# Patient Record
Sex: Male | Born: 1965 | Race: White | Hispanic: No | Marital: Single
Health system: Southern US, Community
[De-identification: ages and names within clinical notes are randomized; demographics above are authoritative.]

---

## 2020-06-20 ENCOUNTER — Ambulatory Visit: Payer: Self-pay | Attending: Internal Medicine

## 2020-06-20 DIAGNOSIS — Z23 Encounter for immunization: Secondary | ICD-10-CM

## 2020-06-20 NOTE — Progress Notes (Signed)
   Covid-19 Vaccination Clinic  Name:  Jeffrey Noble    MRN: [id number] DOB: 10/13/1965  06/20/2020  [name] was observed post Covid-19 immunization for 15 minutes without incident. He was provided with Vaccine Information Sheet and instruction to access the V-Safe system.   [name] was instructed to call 911 with any severe reactions post vaccine: Marland Kitchen Difficulty breathing  . Swelling of face and throat  . A fast heartbeat  . A bad rash all over body  . Dizziness and weakness   Immunizations Administered    No immunizations on file.

## 2020-07-29 ENCOUNTER — Encounter (HOSPITAL_COMMUNITY): Payer: Self-pay | Admitting: Emergency Medicine

## 2020-07-29 ENCOUNTER — Emergency Department (HOSPITAL_COMMUNITY)
Admission: EM | Admit: 2020-07-29 | Discharge: 2020-07-29 | Disposition: A | Discharge status: Home / Self Care | Attending: Emergency Medicine | Admitting: Emergency Medicine

## 2020-07-29 ENCOUNTER — Emergency Department (HOSPITAL_COMMUNITY)

## 2020-07-29 DIAGNOSIS — N201 Calculus of ureter: Secondary | ICD-10-CM | POA: Diagnosis not present

## 2020-07-29 DIAGNOSIS — R109 Unspecified abdominal pain: Secondary | ICD-10-CM | POA: Diagnosis present

## 2020-07-29 LAB — CBC
HCT: 48.9 % (ref 39.0–52.0)
Hemoglobin: 17.2 g/dL — ABNORMAL HIGH (ref 13.0–17.0)
MCH: 32.1 pg (ref 26.0–34.0)
MCHC: 35.2 g/dL (ref 30.0–36.0)
MCV: 91.2 fL (ref 80.0–100.0)
Platelets: 267 10*3/uL (ref 150–400)
RBC: 5.36 MIL/uL (ref 4.22–5.81)
RDW: 13 % (ref 11.5–15.5)
WBC: 14.7 10*3/uL — ABNORMAL HIGH (ref 4.0–10.5)
nRBC: 0 % (ref 0.0–0.2)

## 2020-07-29 LAB — URINALYSIS, ROUTINE W REFLEX MICROSCOPIC
Bilirubin Urine: NEGATIVE
Glucose, UA: NEGATIVE mg/dL
Hgb urine dipstick: NEGATIVE
Ketones, ur: 80 mg/dL — AB
Leukocytes,Ua: NEGATIVE
Nitrite: NEGATIVE
Protein, ur: 30 mg/dL — AB
Specific Gravity, Urine: 1.023 (ref 1.005–1.030)
pH: 6 (ref 5.0–8.0)

## 2020-07-29 LAB — BASIC METABOLIC PANEL
Anion gap: 13 (ref 5–15)
BUN: 20 mg/dL (ref 6–20)
CO2: 22 mmol/L (ref 22–32)
Calcium: 9.7 mg/dL (ref 8.9–10.3)
Chloride: 103 mmol/L (ref 98–111)
Creatinine, Ser: 1.63 mg/dL — ABNORMAL HIGH (ref 0.61–1.24)
GFR, Estimated: 50 mL/min — ABNORMAL LOW (ref >60)
Glucose, Bld: 133 mg/dL — ABNORMAL HIGH (ref 70–99)
Potassium: 3.3 mmol/L — ABNORMAL LOW (ref 3.5–5.1)
Sodium: 138 mmol/L (ref 135–145)

## 2020-07-29 MED ORDER — SODIUM CHLORIDE 0.9 % IV BOLUS (SEPSIS)
1000.0000 mL | Freq: Once | INTRAVENOUS | Status: AC
  Administered 2020-07-29: 09:00:00 1000 mL via INTRAVENOUS

## 2020-07-29 MED ORDER — ONDANSETRON HCL 4 MG/2ML IJ SOLN
4.0000 mg | Freq: Once | INTRAMUSCULAR | Status: AC
  Administered 2020-07-29: 08:00:00 4 mg via INTRAVENOUS
  Filled 2020-07-29: qty 2

## 2020-07-29 MED ORDER — TAMSULOSIN HCL 0.4 MG PO CAPS: 0.4000 mg | ORAL_CAPSULE | Freq: Every day | ORAL | 0 refills | Status: AC

## 2020-07-29 MED ORDER — KETOROLAC TROMETHAMINE 30 MG/ML IJ SOLN
30.0000 mg | Freq: Once | INTRAMUSCULAR | Status: AC
  Administered 2020-07-29: 08:00:00 30 mg via INTRAVENOUS
  Filled 2020-07-29: qty 1

## 2020-07-29 MED ORDER — HYDROCODONE-ACETAMINOPHEN 5-325 MG PO TABS: 1.0000 | ORAL_TABLET | Freq: Four times a day (QID) | ORAL | 0 refills | Status: AC | PRN

## 2020-07-29 MED ORDER — SODIUM CHLORIDE 0.9 % IV SOLN
1000.0000 mL | INTRAVENOUS | Status: DC
  Administered 2020-07-29: 09:00:00 1000 mL via INTRAVENOUS

## 2020-07-29 MED ORDER — ONDANSETRON 4 MG PO TBDP
4.0000 mg | ORAL_TABLET | Freq: Once | ORAL | Status: AC
  Administered 2020-07-29: 07:00:00 4 mg via ORAL
  Filled 2020-07-29: qty 1

## 2020-07-29 MED ORDER — ONDANSETRON 8 MG PO TBDP: 8.0000 mg | ORAL_TABLET | Freq: Three times a day (TID) | ORAL | 0 refills | Status: AC | PRN

## 2020-07-29 NOTE — ED Notes (Signed)
Patient transported to X-ray 

## 2020-07-29 NOTE — Discharge Instructions (Addendum)
The xray did show an area in the iliac bone which may be just a benign finding but the radiologist did recommend having that compared to your recent CT scan.  Follow up with your primary care doctor to review.  Call the urology office to schedule the appointment

## 2020-07-29 NOTE — ED Provider Notes (Signed)
[facility]-EMERGENCY DEPT Provider Note   CSN: [id number] Arrival date & time: 07/29/20  2330     History Chief Complaint  Patient presents with  . Flank Pain    Jeffrey Noble is a 54 y.o. male.  HPI   Patient presents to the ED for evaluation of persistent left flank pain as well as nausea.  Patient states he started having symptoms about a week ago.  He went to the [place].  He ended up having a CT scan and he has the results with him.  It demonstrated a 6 mm left-sided ureteral stone.  Patient states he was prescribed Flomax as well as pain and nausea medications.  He has been taking them but this morning he was having more significant pain and nausea.  He threw up his pain medication.  He continues to feel like he has to urinate but only goes small amounts.  Patient has not been taking the Flomax because he felt like it only made him urinate a lot.  [facility] but he has not had that referral yet.  He denies any fevers or chills.  History reviewed. No pertinent past medical history.  There are no problems to display for this patient.   History reviewed. No pertinent surgical history.     History reviewed. No pertinent family history.     Home Medications Prior to Admission medications   Medication Sig Start Date End Date Taking? Authorizing Provider  escitalopram (LEXAPRO) 10 MG tablet Take 10 mg by mouth daily. 06/22/20   [provider]  gemfibrozil (LOPID) 600 MG tablet Take 600 mg by mouth 2 (two) times daily. 06/30/20   [provider]  HYDROcodone-acetaminophen (NORCO/VICODIN) 5-325 MG tablet Take 1 tablet by mouth every 6 (six) hours as needed. 07/29/20   Linwood Dibbles, MD  meloxicam (MOBIC) 15 MG tablet Take 15 mg by mouth daily. 06/30/20   [provider]  omeprazole (PRILOSEC) 20 MG capsule Take 20 mg by mouth 2 (two) times daily. 06/30/20   [provider]  ondansetron (ZOFRAN ODT) 8 MG disintegrating tablet Take 1 tablet (8 mg total) by mouth every 8 (eight) hours as needed for nausea or vomiting. 07/29/20   Linwood Dibbles, MD  SKYRIZI, 150 MG DOSE, 75 MG/0.83ML PSKT Inject 150 mg into the skin every 3 (three) months. 06/01/20   [provider]  tamsulosin (FLOMAX) 0.4 MG CAPS capsule Take 1 capsule (0.4 mg total) by mouth daily. 07/29/20   Linwood Dibbles, MD  traZODone (DESYREL) 50 MG tablet Take 50 mg by mouth at bedtime. 06/22/20   [provider]    Allergies    Patient has no known allergies.  Review of Systems   Review of Systems  All other systems reviewed and are negative.   Physical Exam Updated Vital Signs BP (!) 153/99   Pulse 69   Temp 97.9 F (36.6 C) (Oral)   Resp 13   Ht 1.753 m (5\' 9" )   Wt 89.8 kg   SpO2 99%   BMI 29.24 kg/m   Physical Exam Vitals and nursing note reviewed.  Constitutional:      Appearance: He is well-developed and well-nourished. He is not diaphoretic.  HENT:     Head: Normocephalic and atraumatic.     Right Ear: External ear normal.     Left Ear: External ear normal.  Eyes:     General: No  scleral icterus.       Right eye: No discharge.        Left eye: No discharge.     Conjunctiva/sclera: Conjunctivae normal.  Neck:     Trachea: No tracheal deviation.  Cardiovascular:     Rate and Rhythm: Normal rate and regular rhythm.     Pulses: Intact distal pulses.  Pulmonary:     Effort: Pulmonary effort is normal. No respiratory distress.     Breath sounds: Normal breath sounds. No stridor. No wheezing or rales.  Abdominal:     General: Bowel sounds are normal. There is no distension.     Palpations: Abdomen is soft.     Tenderness: There is no abdominal tenderness. There is left CVA tenderness. There is no guarding or rebound.  Musculoskeletal:        General: No tenderness or edema.     Cervical back: Neck supple.  Skin:    General: Skin is warm and dry.      Findings: No rash.  Neurological:     Mental Status: He is alert.     Cranial Nerves: No cranial nerve deficit (no facial droop, extraocular movements intact, no slurred speech).     Sensory: No sensory deficit.     Motor: No abnormal muscle tone or seizure activity.     Coordination: Coordination normal.     Deep Tendon Reflexes: Strength normal.  Psychiatric:        Mood and Affect: Mood and affect normal.     ED Results / Procedures / Treatments   Labs (all labs ordered are listed, but only abnormal results are displayed) Labs Reviewed  URINALYSIS, ROUTINE W REFLEX MICROSCOPIC - Abnormal; Notable for the following components:      Result Value   Ketones, ur 80 (*)    Protein, ur 30 (*)    Bacteria, UA RARE (*)    All other components within normal limits  CBC - Abnormal; Notable for the following components:   WBC 14.7 (*)    Hemoglobin 17.2 (*)    All other components within normal limits  BASIC METABOLIC PANEL - Abnormal; Notable for the following components:   Potassium 3.3 (*)    Glucose, Bld 133 (*)    Creatinine, Ser 1.63 (*)    GFR, Estimated 50 (*)    All other components within normal limits  URINE CULTURE    EKG None  Radiology DG Abdomen 1 View  Result Date: 07/29/2020 CLINICAL DATA:  Left flank pain. Nephrolithiasis seen on outside imaging. EXAM: ABDOMEN - 1 VIEW COMPARISON:  None available. FINDINGS: The bowel gas pattern is normal. No radio-opaque calculi or other significant radiographic abnormality are seen. 2.6 cm sclerotic lesion within the inferior right iliac bone, nonspecific. IMPRESSION: 1. No renal calculi identified. 2. Incidental 2.6 cm sclerotic lesion within the inferior right iliac bone, nonspecific but may represent a bone island. Comparison with prior outside imaging of the pelvis would be helpful to to assess for stability. Electronically Signed   By: Duanne Guess D.O.   On: 07/29/2020 08:38    Procedures Procedures (including  critical care time)  Medications Ordered in ED Medications  sodium chloride 0.9 % bolus 1,000 mL (0 mLs Intravenous Stopped 07/29/20 0903)    Followed by  0.9 %  sodium chloride infusion (1,000 mLs Intravenous New Bag/Given 07/29/20 0920)  ondansetron (ZOFRAN-ODT) disintegrating tablet 4 mg (4 mg Oral Given 07/29/20 0725)  ketorolac (TORADOL) 30 MG/ML injection 30 mg (30  mg Intravenous Given 07/29/20 0800)  ondansetron (ZOFRAN) injection 4 mg (4 mg Intravenous Given 07/29/20 0800)    ED Course  I have reviewed the triage vital signs and the nursing notes.  Pertinent labs & imaging results that were available during my care of the patient were reviewed by me and considered in my medical decision making (see chart for details).  Clinical Course as of 07/29/20 1419  Wed Jul 29, 2020  1210 Plain film does not show any ureteral stones although patient did have this confirmed on her prior outpatient CT.  Incidental bony abnormality noted [JK]  1346 Patient's laboratory tests showed no evaded creatinine.  Urinalysis does show white blood cells and RBCs but no definite infection. [JK]    Clinical Course User Index [JK] Linwood Dibbles, MD   MDM Rules/Calculators/A&P                          Patient presented to the ED for evaluation of persistent flank pain associated with recently diagnosed ureteral stone. In the ED the patient is still having some flank pain but this was managed with a dose of Toradol. Patient's laboratory tests do show an increase in his creatinine but I do not have any old for comparison. Urinalysis does not look like infection. I discussed the case with Dr. Jacquelyne Balint urology. Plan on close outpatient follow-up in the urology office. Patient will call the office to schedule appointment. Continue the patient on Zofran hydrocodone and Flomax.  Incidental finding noted in the iliac region. We will asked patient follow-up with and compared to his recent CT scan Final Clinical  Impression(s) / ED Diagnoses Final diagnoses:  Ureteral stone    Rx / DC Orders ED Discharge Orders         Ordered    HYDROcodone-acetaminophen (NORCO/VICODIN) 5-325 MG tablet  Every 6 hours PRN        07/29/20 1418    ondansetron (ZOFRAN ODT) 8 MG disintegrating tablet  Every 8 hours PRN        07/29/20 1418    tamsulosin (FLOMAX) 0.4 MG CAPS capsule  Daily        07/29/20 1418           Linwood Dibbles, MD 07/29/20 1421

## 2020-07-29 NOTE — ED Triage Notes (Signed)
Pt reports known kidney stones. States that he has imaging from the [place]. Reports nausea and L flank pain. Reports difficulty urinating.

## 2020-07-29 NOTE — ED Notes (Signed)
Completed bladder scan. Bladder scan showed volume of 62 mL.

## 2020-07-31 LAB — URINE CULTURE: Culture: <10000 — AB

## 2020-12-14 ENCOUNTER — Other Ambulatory Visit: Payer: Self-pay

## 2020-12-14 ENCOUNTER — Other Ambulatory Visit (HOSPITAL_BASED_OUTPATIENT_CLINIC_OR_DEPARTMENT_OTHER): Payer: Self-pay

## 2020-12-14 ENCOUNTER — Ambulatory Visit: Attending: Internal Medicine

## 2020-12-14 DIAGNOSIS — Z23 Encounter for immunization: Secondary | ICD-10-CM

## 2020-12-14 MED ORDER — MODERNA COVID-19 VACCINE 100 MCG/0.5ML IM SUSP
INTRAMUSCULAR | 0 refills | Status: AC
  Filled 2020-12-14: qty 0.25, 1d supply, fill #0

## 2020-12-14 NOTE — Progress Notes (Signed)
   Covid-19 Vaccination Clinic  Name:  Anant Agard    MRN: [id number] DOB: Jan 03, 1966  12/14/2020  Mr. Gingerich was observed post Covid-19 immunization for 15 minutes without incident. He was provided with Vaccine Information Sheet and instruction to access the V-Safe system.   Mr. Thall was instructed to call 911 with any severe reactions post vaccine: Marland Kitchen Difficulty breathing  . Swelling of face and throat  . A fast heartbeat  . A bad rash all over body  . Dizziness and weakness   Immunizations Administered    Name Date Dose VIS Date Route   Moderna Covid-19 Booster Vaccine 12/14/2020  1:39 PM 0.25 mL 05/27/2020 Intramuscular   Manufacturer: Moderna   Lot: [id number]   NDC: 20254-270-62

## 2021-06-17 ENCOUNTER — Ambulatory Visit

## 2021-09-08 IMAGING — CR DG ABDOMEN 1V
2 series · 2 of 2 positions shown · non-contrast
Comparison: None available.

CLINICAL DATA: Left flank pain. Nephrolithiasis seen on outside
imaging.

EXAM:
ABDOMEN - 1 VIEW

[t abdomen supine (1 of 2)]
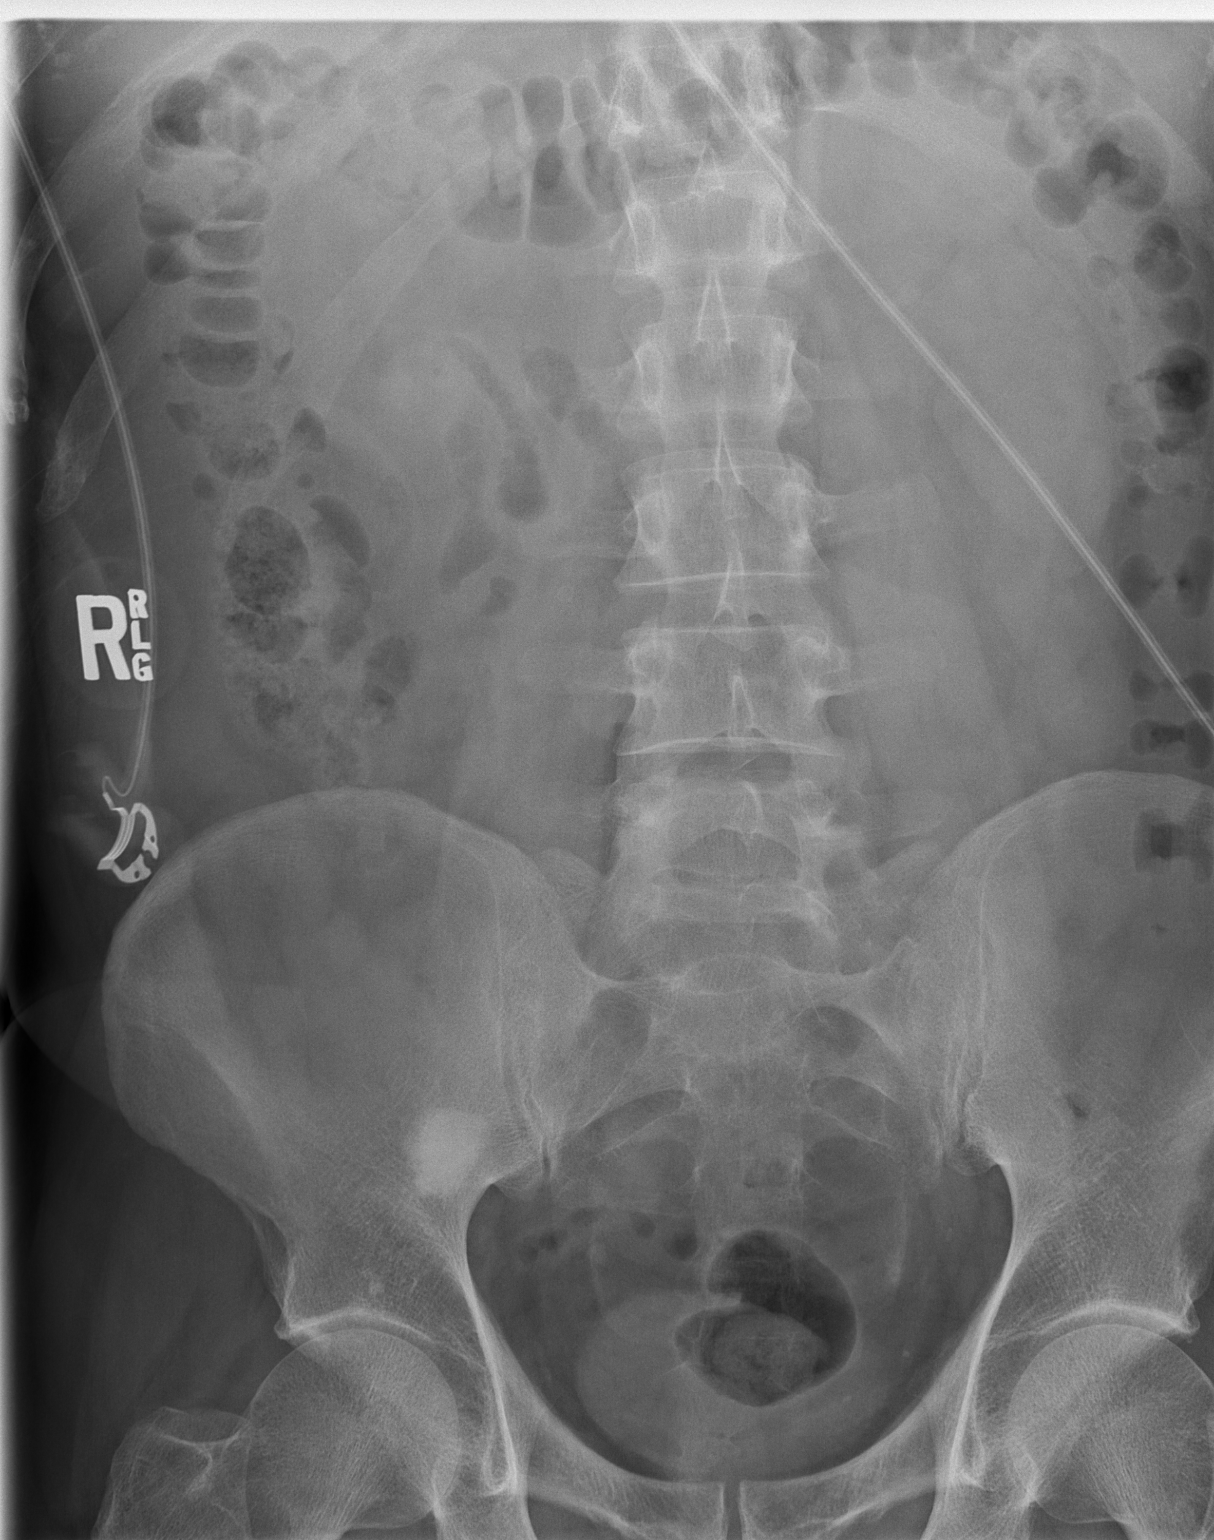

[t abdomen supine (2 of 2)]
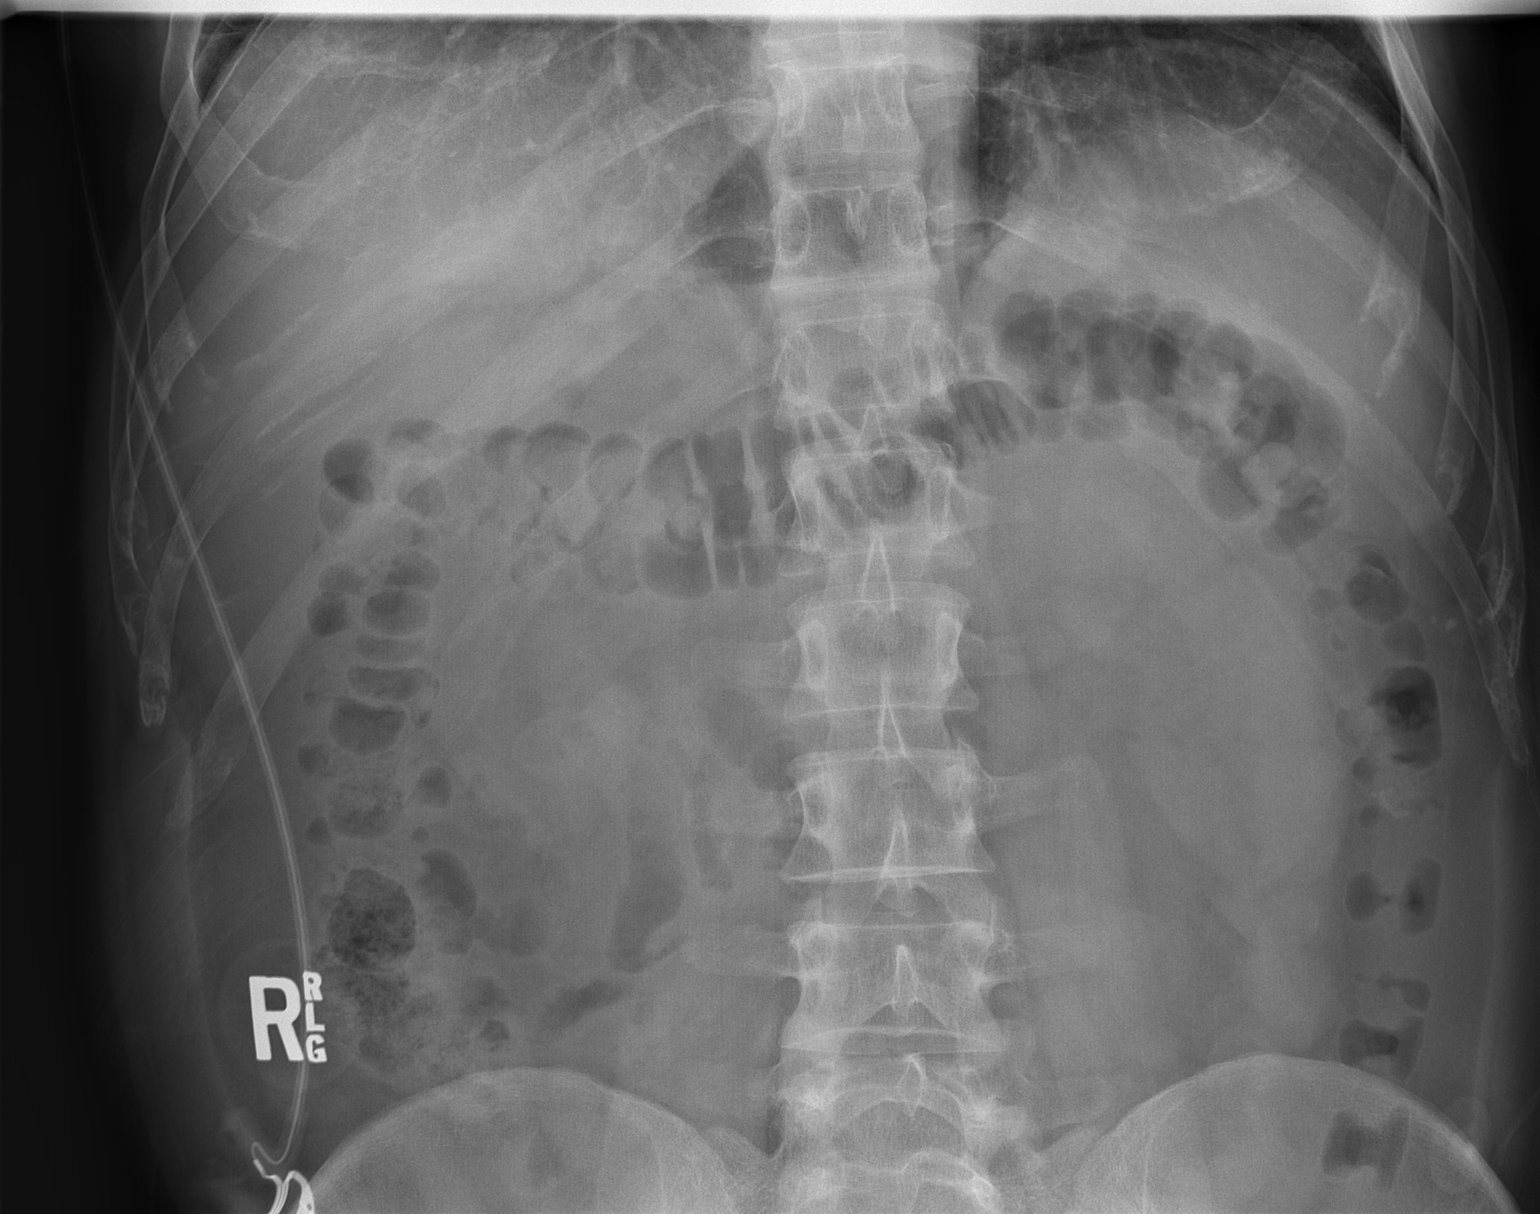

[2 of 2 positions shown; findings below may reference images not displayed]

FINDINGS: The bowel gas pattern is normal. No radio-opaque calculi or other
significant radiographic abnormality are seen. 2.6 cm sclerotic
lesion within the inferior right iliac bone, nonspecific.
IMPRESSION: 1. No renal calculi identified.
2. Incidental 2.6 cm sclerotic lesion within the inferior right
iliac bone, nonspecific but may represent a bone island. Comparison
with prior outside imaging of the pelvis would be helpful to to
assess for stability.
